# Patient Record
Sex: Male | Born: 1995 | Race: Black or African American | Hispanic: No | Marital: Single | Smoking: Never smoker
Health system: Southern US, Community
[De-identification: ages and names within clinical notes are randomized; demographics above are authoritative.]

---
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
## 2023-11-19 ENCOUNTER — Ambulatory Visit
Admission: EM | Admit: 2023-11-19 | Discharge: 2023-11-19 | Disposition: A | Discharge status: Home / Self Care | Payer: Self-pay

## 2023-11-19 DIAGNOSIS — B084 Enteroviral vesicular stomatitis with exanthem: Secondary | ICD-10-CM

## 2023-11-19 NOTE — ED Provider Notes (Signed)
 [facility]    CSN: [id number] Arrival date & time: 11/19/23  1103      History   Chief Complaint Chief Complaint  Patient presents with   Rash    HPI Wendell Nicoson Olenik is a 28 y.o. male.   The history is provided by the patient.   Patient presents with a 2-day history of rash on his hands and bilateral feet.  Patient states that the rash started today, with tingling on the bottom of his feet and on his hands.  He denies fever, chills, headache, sore throat, abdominal pain, nausea, vomiting, or diarrhea.  Patient reports that his son was diagnosed with hand-foot-and-mouth recently.  History reviewed. No pertinent past medical history.  There are no active problems to display for this patient.   History reviewed. No pertinent surgical history.     Home Medications    Prior to Admission medications   Medication Sig Start Date End Date Taking? Authorizing Provider  acetaminophen  (TYLENOL ) 500 MG tablet Take 500 mg by mouth 3 (three) times daily as needed for mild pain.    [provider]  cyclobenzaprine  (FLEXERIL ) 5 MG tablet Take 1 tablet (5 mg total) by mouth 3 (three) times daily as needed for muscle spasms. 06/26/13   Idol, Julie, PA-C  ibuprofen  (ADVIL ,MOTRIN ) 600 MG tablet Take 600 mg by mouth 2 (two) times daily as needed for mild pain.    [provider]  ibuprofen  (ADVIL ,MOTRIN ) 600 MG tablet Take 1 tablet (600 mg total) by mouth every 6 (six) hours as needed. 06/26/13   Birdena Clarity, PA-C    Family History History reviewed. No pertinent family history.  Social History Social History   Tobacco Use   Smoking status: Never   Smokeless tobacco: Never  Substance Use Topics   Alcohol use: No   Drug use: Yes    Frequency: 4.0 times per week    Types: Marijuana     Allergies   Patient has no known allergies.   Review of Systems Review of Systems Per HPI  Physical Exam Triage Vital Signs ED Triage Vitals  Encounter Vitals  Group     BP 11/19/23 1128 (!) 132/91     Girls Systolic BP Percentile --      Girls Diastolic BP Percentile --      Boys Systolic BP Percentile --      Boys Diastolic BP Percentile --      Pulse Rate 11/19/23 1128 60     Resp 11/19/23 1128 16     Temp 11/19/23 1128 98.5 F (36.9 C)     Temp Source 11/19/23 1128 Oral     SpO2 11/19/23 1128 98 %     Weight --      Height --      Head Circumference --      Peak Flow --      Pain Score 11/19/23 1129 7     Pain Loc --      Pain Education --      Exclude from Growth Chart --    No data found.  Updated Vital Signs BP (!) 132/91 (BP Location: Right Arm)   Pulse 60   Temp 98.5 F (36.9 C) (Oral)   Resp 16   SpO2 98%   Visual Acuity Right Eye Distance:   Left Eye Distance:   Bilateral Distance:    Right Eye Near:   Left Eye Near:    Bilateral Near:     Physical  Exam Vitals and nursing note reviewed.  Constitutional:      General: He is not in acute distress.    Appearance: Normal appearance.  HENT:     Head: Normocephalic.     Right Ear: Tympanic membrane, ear canal and external ear normal.     Left Ear: Tympanic membrane, ear canal and external ear normal.     Nose: Nose normal.     Mouth/Throat:     Mouth: Mucous membranes are moist.  Eyes:     Extraocular Movements: Extraocular movements intact.     Conjunctiva/sclera: Conjunctivae normal.     Pupils: Pupils are equal, round, and reactive to light.  Cardiovascular:     Rate and Rhythm: Normal rate and regular rhythm.     Pulses: Normal pulses.     Heart sounds: Normal heart sounds.  Pulmonary:     Effort: Pulmonary effort is normal.     Breath sounds: Normal breath sounds.  Abdominal:     General: Bowel sounds are normal.     Palpations: Abdomen is soft.  Musculoskeletal:     Cervical back: Normal range of motion.  Skin:    General: Skin is warm and dry.     Findings: Rash present.     Comments: Multiple erythematous maculopapular rash observed on the  palms of the bilateral hands and on the soles of the feet.  There is no oozing, fluctuance, or drainage present.  Neurological:     General: No focal deficit present.     Mental Status: He is alert and oriented to person, place, and time.  Psychiatric:        Mood and Affect: Mood normal.        Behavior: Behavior normal.      UC Treatments / Results  Labs (all labs ordered are listed, but only abnormal results are displayed) Labs Reviewed - No data to display  EKG   Radiology No results found.  Procedures Procedures (including critical care time)  Medications Ordered in UC Medications - No data to display  Initial Impression / Assessment and Plan / UC Course  I have reviewed the triage vital signs and the nursing notes.  Pertinent labs & imaging results that were available during my care of the patient were reviewed by me and considered in my medical decision making (see chart for details).  Symptoms consistent with hand-foot-and-mouth disease.  Supportive care recommendations were provided and discussed with the patient to include over-the-counter analgesics, fluids, and rest.  Discussed indications with patient regarding follow-up.  Patient was in agreement with this plan of care and verbalizes understanding.  All questions were answered.  Patient stable for discharge.  Final Clinical Impressions(s) / UC Diagnoses   Final diagnoses:  None   Discharge Instructions   None    ED Prescriptions   None    PDMP not reviewed this encounter.   Gilmer Etta PARAS, NP 11/19/23 1143

## 2023-11-19 NOTE — ED Triage Notes (Signed)
 Pt c/o tingling on his hands and feet x 2 days and now has a few small red bumps that showed up there today. Pt states his son had hand, foot, mouth recently.

## 2023-11-19 NOTE — Discharge Instructions (Signed)
 Hand, foot, and mouth disease (HFMD) is a mild viral infection that rarely causes further complications. Antibiotics do not work on viruses and are not given. HFMD will get better on its own, but there are ways you can care for yourself at home. Provide symptomatic treatment with ibuprofen  and Tylenol , and provide fluids or liquids if any sores develop in the mouth  If you are in pain or uncomfortable you may take pain relievers such as acetaminophen  or ibuprofen . Do not take aspirin. Increase your fluid intake to prevent dehydration.  Leave blisters to dry naturally. Do not pierce or squeeze them. Go to the emergency department if you develop difficulty swallowing, excessive drooling, or unable to tolerate liquids.  Key points to remember: HFMD is a mild illness that will get better on its own. Two types of viruses cause HFMD, and the rash depends on which virus your child has. HFMD is spread easily from one person to another.  Stay at home until you have been fever-free for 24 hours without the development of new lesions or rash.
